# Patient Record
Sex: Male | Born: 1995 | Race: White | Hispanic: Yes | Marital: Single | Smoking: Never smoker
Health system: Southern US, Community
[De-identification: ages and names within clinical notes are randomized; demographics above are authoritative.]

---

## 2018-03-01 ENCOUNTER — Other Ambulatory Visit: Payer: Self-pay

## 2018-03-01 ENCOUNTER — Emergency Department (HOSPITAL_BASED_OUTPATIENT_CLINIC_OR_DEPARTMENT_OTHER): Payer: BLUE CROSS/BLUE SHIELD

## 2018-03-01 ENCOUNTER — Emergency Department (HOSPITAL_BASED_OUTPATIENT_CLINIC_OR_DEPARTMENT_OTHER)
Admission: EM | Admit: 2018-03-01 | Discharge: 2018-03-01 | Disposition: A | Discharge status: Home / Self Care | Payer: BLUE CROSS/BLUE SHIELD | Attending: Emergency Medicine | Admitting: Emergency Medicine

## 2018-03-01 ENCOUNTER — Encounter (HOSPITAL_BASED_OUTPATIENT_CLINIC_OR_DEPARTMENT_OTHER): Payer: Self-pay | Admitting: *Deleted

## 2018-03-01 DIAGNOSIS — J111 Influenza due to unidentified influenza virus with other respiratory manifestations: Secondary | ICD-10-CM | POA: Insufficient documentation

## 2018-03-01 DIAGNOSIS — R509 Fever, unspecified: Secondary | ICD-10-CM | POA: Diagnosis present

## 2018-03-01 DIAGNOSIS — R69 Illness, unspecified: Secondary | ICD-10-CM

## 2018-03-01 MED ORDER — IBUPROFEN 400 MG PO TABS
600.0000 mg | ORAL_TABLET | Freq: Once | ORAL | Status: AC
  Administered 2018-03-01: 600 mg via ORAL
  Filled 2018-03-01: qty 1

## 2018-03-01 NOTE — ED Provider Notes (Signed)
[facility] EMERGENCY DEPARTMENT Provider Note   CSN: [id number] Arrival date & time: 03/01/18  1727     History   Chief Complaint Chief Complaint  Patient presents with  . Fever  . Generalized Body Aches    HPI Kenneth Atkinson is a 23 y.o. male who presents with a fever and body aches.  No significant past medical history.  Patient states that he has had sinus congestion for about 1 week and has not been feeling well.  He went to urgent care a couple days ago and was diagnosed with an ear infection and treated with Augmentin.  He is also been taking Mucinex for coughing.  He had a negative flu test at urgent care last week.  This morning he woke up with acute onset of fever, chills, body aches.  His ear pain is improving.  His cough is not improving.  He denies chest pain, shortness of breath, abdominal pain, vomiting, urinary symptoms.  He has had a little bit of diarrhea and nausea. He has not had a flu shot this year.  HPI  History reviewed. No pertinent past medical history.  There are no active problems to display for this patient.   History reviewed. No pertinent surgical history.      Home Medications    Prior to Admission medications   Not on File    Family History No family history on file.  Social History Social History   Tobacco Use  . Smoking status: Never Smoker  . Smokeless tobacco: Never Used  Substance Use Topics  . Alcohol use: Never    Frequency: Never  . Drug use: Never     Allergies   Patient has no known allergies.   Review of Systems Review of Systems  Constitutional: Positive for chills, diaphoresis and fever.  HENT: Positive for ear pain and rhinorrhea. Negative for sore throat.   Respiratory: Positive for cough. Negative for shortness of breath.   Cardiovascular: Negative for chest pain.  Gastrointestinal: Positive for diarrhea and nausea. Negative for abdominal pain, constipation and vomiting.  Musculoskeletal:  Positive for back pain and myalgias.     Physical Exam Updated Vital Signs BP 130/86 (BP Location: Left Arm)   Pulse 86   Temp (!) 100.5 F (38.1 C) (Oral)   Resp 16   Ht 5\' 10"  (1.778 m)   Wt 84.8 kg   SpO2 98%   BMI 26.83 kg/m   Physical Exam Vitals signs and nursing note reviewed.  Constitutional:      General: He is not in acute distress.    Appearance: Normal appearance. He is well-developed.     Comments: Calm and cooperative. NAD  HENT:     Head: Normocephalic and atraumatic.     Right Ear: Hearing, tympanic membrane, ear canal and external ear normal.     Left Ear: Hearing, tympanic membrane, ear canal and external ear normal.     Nose: Rhinorrhea present.     Mouth/Throat:     Lips: Pink.     Mouth: Mucous membranes are moist.     Pharynx: Oropharynx is clear.  Eyes:     General: No scleral icterus.       Right eye: No discharge.        Left eye: No discharge.     Conjunctiva/sclera: Conjunctivae normal.     Pupils: Pupils are equal, round, and reactive to light.  Neck:     Musculoskeletal: Normal range of motion.  Cardiovascular:  Rate and Rhythm: Normal rate and regular rhythm.  Pulmonary:     Effort: Pulmonary effort is normal. No respiratory distress.     Breath sounds: Normal breath sounds.  Abdominal:     General: There is no distension.     Palpations: Abdomen is soft.     Tenderness: There is no abdominal tenderness.  Skin:    General: Skin is warm and dry.  Neurological:     Mental Status: He is alert and oriented to person, place, and time.  Psychiatric:        Behavior: Behavior normal.      ED Treatments / Results  Labs (all labs ordered are listed, but only abnormal results are displayed) Labs Reviewed - No data to display  EKG None  Radiology Dg Chest 2 View  Result Date: 03/01/2018 CLINICAL DATA:  Back pain, fever and shaking beginning today. EXAM: CHEST - 2 VIEW COMPARISON:  None. FINDINGS: Lungs clear. Heart size  normal. There is some peribronchial thickening. No pneumothorax or pleural fluid. Heart size is normal. No bony abnormality. IMPRESSION: Peribronchial thickening compatible with bronchitis. Negative for pneumonia. Electronically Signed   By: Drusilla Kannerhomas  Dalessio M.D.   On: 03/01/2018 18:53    Procedures Procedures (including critical care time)  Medications Ordered in ED Medications  ibuprofen (ADVIL,MOTRIN) tablet 600 mg (600 mg Oral Given 03/01/18 1833)     Initial Impression / Assessment and Plan / ED Course  I have reviewed the triage vital signs and the nursing notes.  Pertinent labs & imaging results that were available during my care of the patient were reviewed by me and considered in my medical decision making (see chart for details).  23 year old male with flu like symptoms. He is febrile here but otherwise vitals are normal. Exam is consistent with flu or other viral illness. CXR was obtained since he is not improving after 1 week which was negative for pneumonia and looks consistent with bronchitis. He was encouraged to continue to alternate Tylenol/Ibuprofen, continue antibiotics prescribed to him, and return if worsening  Final Clinical Impressions(s) / ED Diagnoses   Final diagnoses:  Influenza-like illness    ED Discharge Orders    None       Bethel BornGekas, Dalayna Lauter Marie, PA-C 03/01/18 2043    Alvira MondaySchlossman, Erin, MD 03/06/18 [phone]

## 2018-03-01 NOTE — Discharge Instructions (Signed)
Please rest and drink plenty of fluids Continue Mucinex every 4 hours Take Ibuprofen 600mg  every 6-8 hours Continue antibiotic for sinus infection and you can add flonase for nasal congestion Return if you are worsening

## 2018-03-01 NOTE — ED Triage Notes (Signed)
Fever, ear pain and body pain. He had a negative flu test at [facility] last week. He was treated for an ear infection with an antibiotic.

## 2018-03-05 ENCOUNTER — Other Ambulatory Visit: Payer: Self-pay

## 2018-03-05 ENCOUNTER — Emergency Department (HOSPITAL_BASED_OUTPATIENT_CLINIC_OR_DEPARTMENT_OTHER)
Admission: EM | Admit: 2018-03-05 | Discharge: 2018-03-05 | Disposition: A | Discharge status: Home / Self Care | Payer: BLUE CROSS/BLUE SHIELD | Attending: Emergency Medicine | Admitting: Emergency Medicine

## 2018-03-05 ENCOUNTER — Encounter (HOSPITAL_BASED_OUTPATIENT_CLINIC_OR_DEPARTMENT_OTHER): Payer: Self-pay | Admitting: *Deleted

## 2018-03-05 DIAGNOSIS — B09 Unspecified viral infection characterized by skin and mucous membrane lesions: Secondary | ICD-10-CM | POA: Insufficient documentation

## 2018-03-05 DIAGNOSIS — R21 Rash and other nonspecific skin eruption: Secondary | ICD-10-CM | POA: Diagnosis present

## 2018-03-05 NOTE — ED Triage Notes (Signed)
Pt reports rash to body onset this am, itching only to left inner wrist area. Denies any other c/o.

## 2018-03-05 NOTE — ED Provider Notes (Signed)
[facility] EMERGENCY DEPARTMENT Provider Note   CSN: [id number] Arrival date & time: 03/05/18  0825     History   Chief Complaint Chief Complaint  Patient presents with  . Rash    HPI Kenneth Atkinson is a 23 y.o. male.  Patient is a 23 year old male who presents with a rash.  He has been having some URI symptoms for about 2 weeks.  He initially was diagnosed with an otitis media and was started on Augmentin.  He has 1 days worth of Augmentin left.  He says overall his URI symptoms have improved but yesterday broke out in a rash.  It started on his arms and now has progressed throughout his body.  He denies any significant itching associated with it.  No shortness of breath.  No swelling of his lips or tongue.  No prior history of allergic reactions.  He denies any burning on urination.  No history of STDs.  No penile discharge.     History reviewed. No pertinent past medical history.  There are no active problems to display for this patient.   History reviewed. No pertinent surgical history.      Home Medications    Prior to Admission medications   Medication Sig Start Date End Date Taking? Authorizing Provider  Amoxicillin-Pot Clavulanate (AUGMENTIN PO) Take by mouth.   Yes [provider]    Family History History reviewed. No pertinent family history.  Social History Social History   Tobacco Use  . Smoking status: Never Smoker  . Smokeless tobacco: Never Used  Substance Use Topics  . Alcohol use: Never    Frequency: Never  . Drug use: Never     Allergies   Patient has no known allergies.   Review of Systems Review of Systems  Constitutional: Negative for chills, diaphoresis, fatigue and fever.  HENT: Positive for rhinorrhea. Negative for congestion and sneezing.   Eyes: Negative.   Respiratory: Positive for cough. Negative for chest tightness and shortness of breath.   Cardiovascular: Negative for chest pain and leg swelling.    Gastrointestinal: Negative for abdominal pain, blood in stool, diarrhea, nausea and vomiting.  Genitourinary: Negative for difficulty urinating, flank pain, frequency and hematuria.  Musculoskeletal: Negative for arthralgias and back pain.  Skin: Positive for rash.  Neurological: Negative for dizziness, speech difficulty, weakness, numbness and headaches.     Physical Exam Updated Vital Signs BP 127/85 (BP Location: Left Arm)   Pulse 60   Temp 98.6 F (37 C) (Oral)   Resp 16   Ht 5\' 10"  (1.778 m)   Wt 84.8 kg   SpO2 100%   BMI 26.83 kg/m   Physical Exam Constitutional:      Appearance: He is well-developed.  HENT:     Head: Normocephalic and atraumatic.     Comments: Mild clear fluid behind the TMs bilaterally without signs of infection.  No angioedema    Right Ear: Tympanic membrane normal.     Left Ear: Tympanic membrane normal.  Eyes:     Pupils: Pupils are equal, round, and reactive to light.  Neck:     Musculoskeletal: Normal range of motion and neck supple.  Cardiovascular:     Rate and Rhythm: Normal rate and regular rhythm.     Heart sounds: Normal heart sounds.  Pulmonary:     Effort: Pulmonary effort is normal. No respiratory distress.     Breath sounds: Normal breath sounds. No wheezing or rales.  Chest:  Chest wall: No tenderness.  Abdominal:     General: Bowel sounds are normal.     Palpations: Abdomen is soft.     Tenderness: There is no abdominal tenderness. There is no guarding or rebound.  Musculoskeletal: Normal range of motion.  Lymphadenopathy:     Cervical: No cervical adenopathy.  Skin:    General: Skin is warm and dry.     Findings: Rash present.     Comments: Patient has a diffuse macular rash.  This most prominent on his upper extremities.  It is blanching.  There appears to be a few spots on his left palm.  There is no petechiae or purpura.  No vesicles.  Neurological:     Mental Status: He is alert and oriented to person, place, and  time.      ED Treatments / Results  Labs (all labs ordered are listed, but only abnormal results are displayed) Labs Reviewed  RPR    EKG None  Radiology No results found.  Procedures Procedures (including critical care time)  Medications Ordered in ED Medications - No data to display   Initial Impression / Assessment and Plan / ED Course  I have reviewed the triage vital signs and the nursing notes.  Pertinent labs & imaging results that were available during my care of the patient were reviewed by me and considered in my medical decision making (see chart for details).     Patient presents with a rash following a viral illness.  It appears to be most consistent with a viral exanthem.  He did have a few questionable lesions on his palms so I did do an RPR.  There is no oral lesions.  He does not have suggestions of strep throat.  It does not look like a scarlatiniform rash.  There is no suggestions of Stevens-Johnson's.  It does not look more consistent with an allergic reaction although he is essentially completed the amoxicillin so I told him just to go ahead and stop it.  It does not look consistent with [facility] spotted fever.  He was discharged home in good condition.  He was advised in symptomatic care.  Return precautions were given.  Final Clinical Impressions(s) / ED Diagnoses   Final diagnoses:  Viral exanthem    ED Discharge Orders    None       Rolan Bucco, MD 03/05/18 [phone]

## 2018-03-06 LAB — RPR: RPR Ser Ql: NONREACTIVE

## 2020-04-11 IMAGING — CR DG CHEST 2V
2 series · 2 of 2 positions shown · non-contrast
Comparison: None.

CLINICAL DATA: Back pain, fever and shaking beginning today.

EXAM:
CHEST - 2 VIEW

[w chest pa]
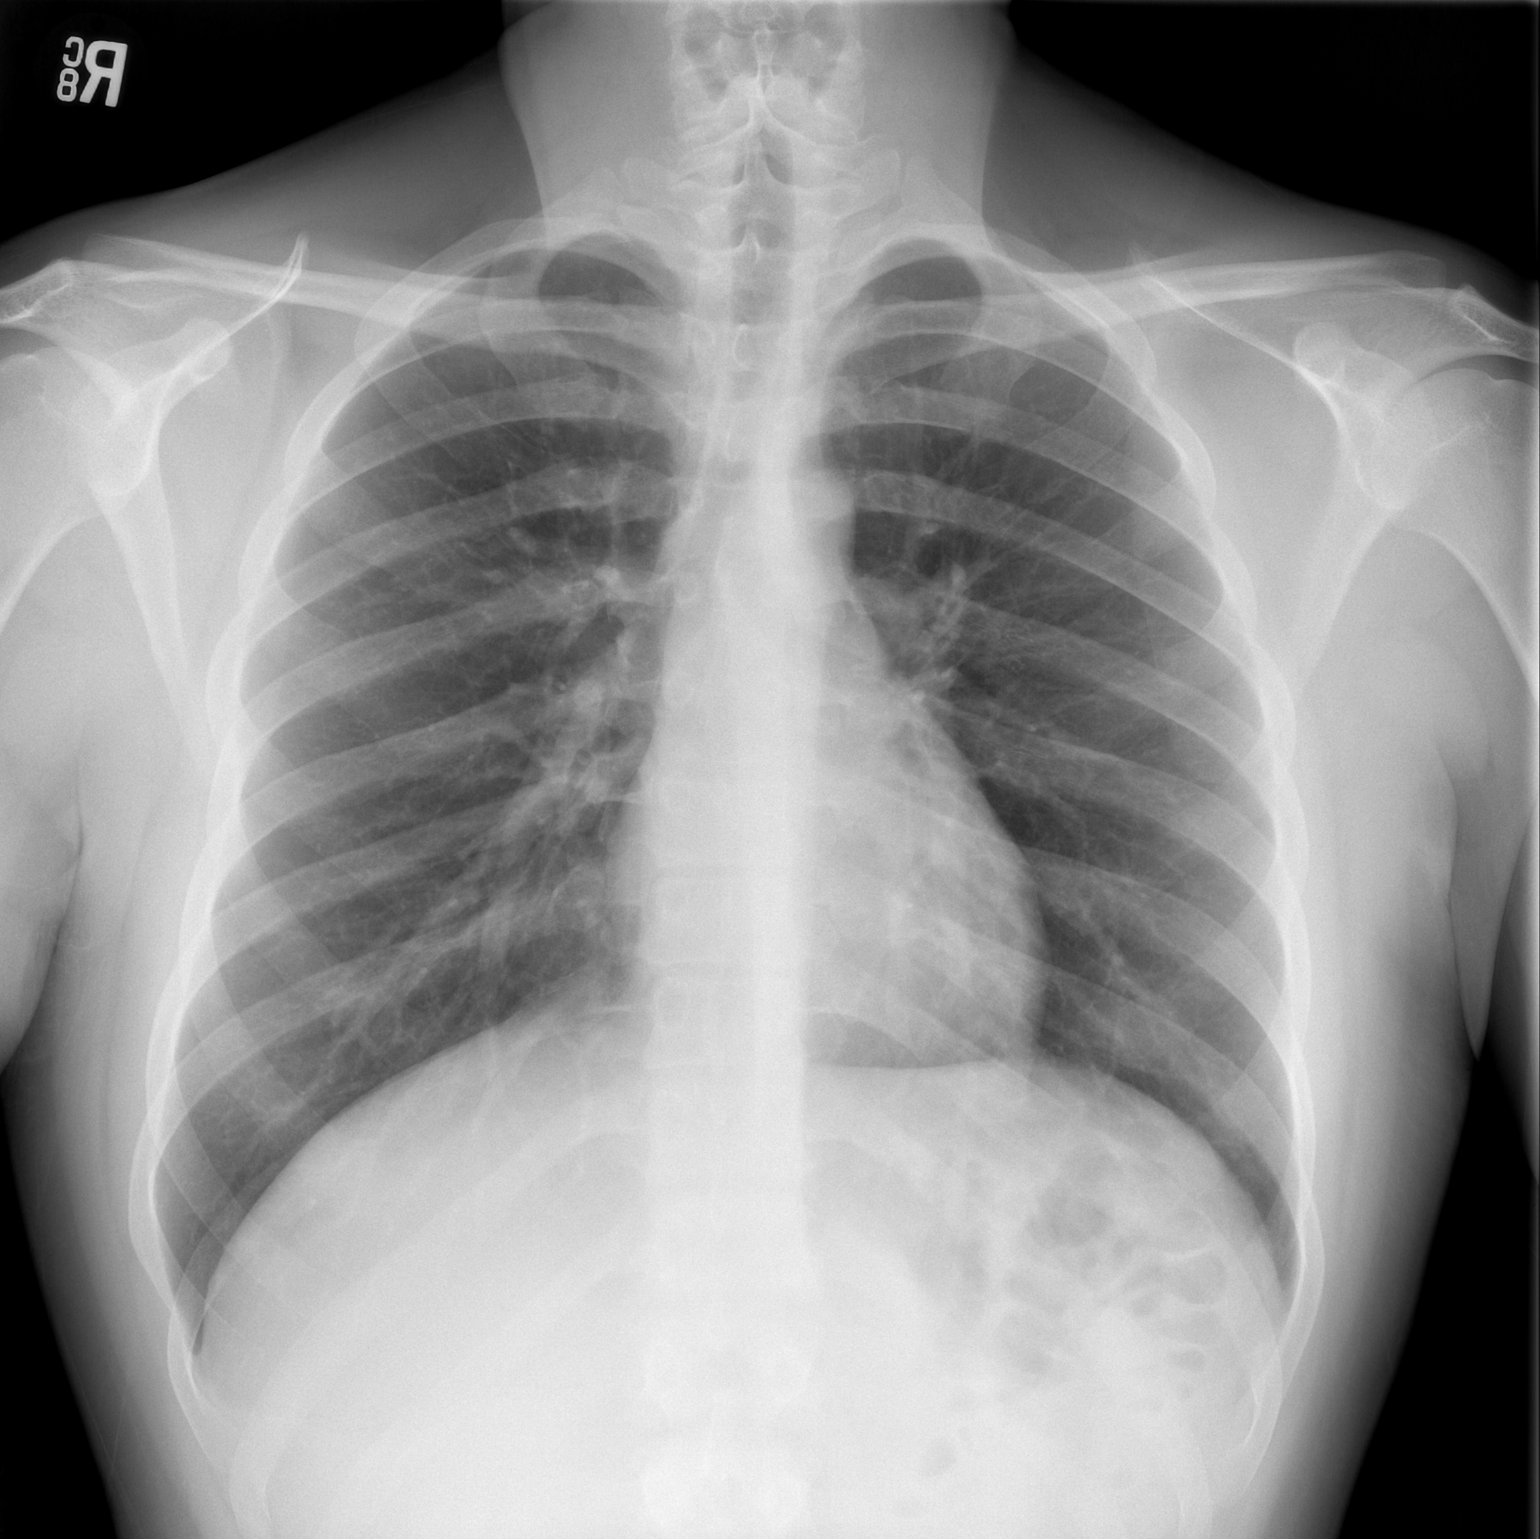

[w chest lat]
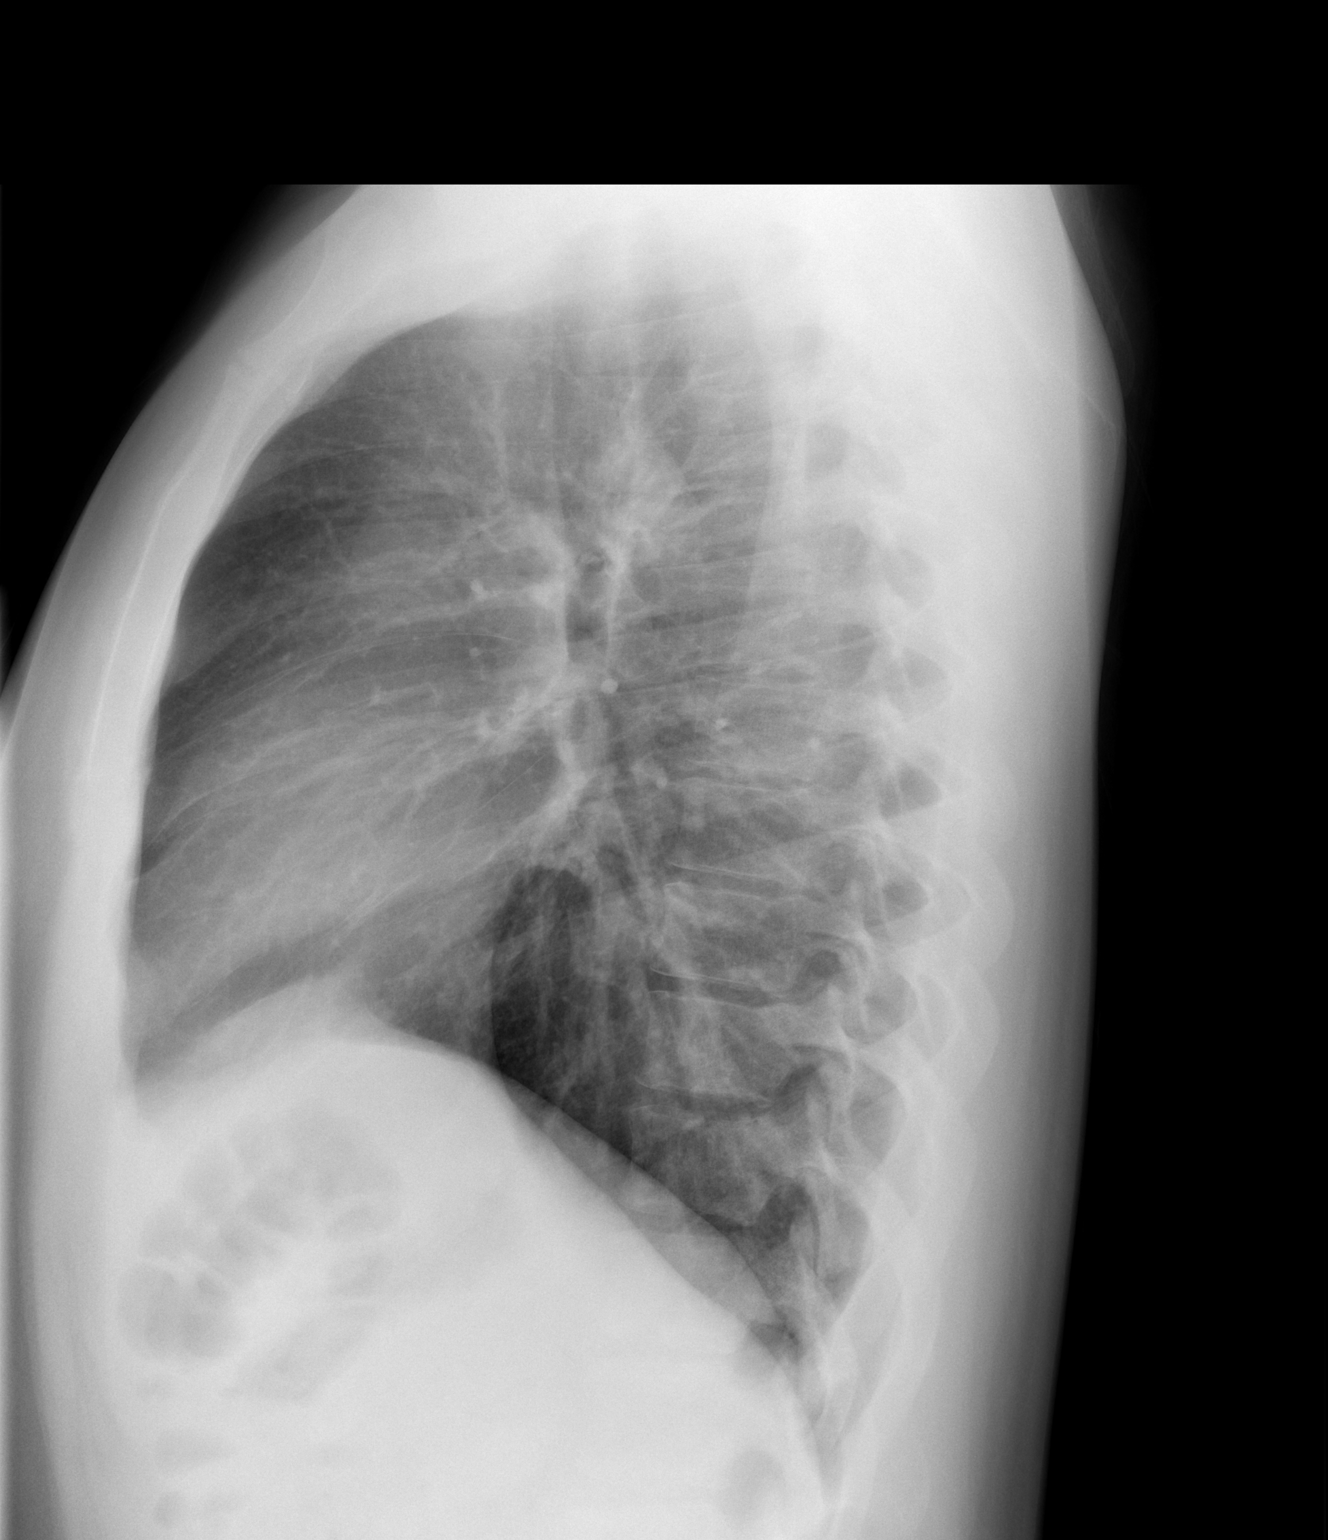

[2 of 2 positions shown; findings below may reference images not displayed]

FINDINGS: Lungs clear. Heart size normal. There is some peribronchial
thickening. No pneumothorax or pleural fluid. Heart size is normal.
No bony abnormality.
IMPRESSION: Peribronchial thickening compatible with bronchitis. Negative for
pneumonia.
